# Patient Record
Sex: Male | Born: 2000 | Race: Black or African American | Hispanic: No | Marital: Single | Smoking: Current some day smoker
Health system: Southern US, Community
[De-identification: ages and names within clinical notes are randomized; demographics above are authoritative.]

---
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
## 2021-07-14 ENCOUNTER — Other Ambulatory Visit: Payer: Self-pay

## 2021-07-14 ENCOUNTER — Emergency Department (HOSPITAL_COMMUNITY)
Admission: EM | Admit: 2021-07-14 | Discharge: 2021-07-14 | Disposition: A | Discharge status: Home / Self Care | Payer: Medicaid Other | Attending: Emergency Medicine | Admitting: Emergency Medicine

## 2021-07-14 ENCOUNTER — Encounter (HOSPITAL_COMMUNITY): Payer: Self-pay | Admitting: *Deleted

## 2021-07-14 DIAGNOSIS — R112 Nausea with vomiting, unspecified: Secondary | ICD-10-CM | POA: Insufficient documentation

## 2021-07-14 DIAGNOSIS — Z1152 Encounter for screening for COVID-19: Secondary | ICD-10-CM

## 2021-07-14 DIAGNOSIS — Z20822 Contact with and (suspected) exposure to covid-19: Secondary | ICD-10-CM | POA: Insufficient documentation

## 2021-07-14 DIAGNOSIS — F17210 Nicotine dependence, cigarettes, uncomplicated: Secondary | ICD-10-CM | POA: Insufficient documentation

## 2021-07-14 NOTE — Discharge Instructions (Addendum)
Your exam today is reassuring and since your symptoms have been completely resolved for the past 8 hours, there is no need for further testing here.  You have been screened for COVID-19 per your employer's request.  It will be safe for you to return to work once this COVID test is resulted and is negative.  If it is positive, you will need to stay out of work for 5 days from today's date, returning on September 19, assuming you are symptom-free at that time.  Return here for any return or worsening of your symptoms.

## 2021-07-14 NOTE — ED Provider Notes (Signed)
[facility] EMERGENCY DEPARTMENT Provider Note   CSN: [id number] Arrival date & time: 07/14/21  1731     History Chief Complaint  Patient presents with   Covid Exposure    Daniel Bowers is a 20 y.o. male with no significant past medical history but he did have a significant surgical history including a partial colectomy secondary to perforated bowel in 2021 presenting with complaint of nausea and vomiting.  He was at work early this morning when he had back-to-back episodes of vomiting approximately 7 episodes of nonbloody emesis.  This was not accompanied by abdominal pain but he did have nausea.  He he was sent home from work and he slept until he woke around noon.  He has been able to eat and drink since waking and he denies any further symptoms specifically no nausea or vomiting, no abdominal pain or distention.  He also denies fevers or chills or any other complaints at this time.  He states he is essentially here at his employer's request to be screened for COVID-19 so that he can return to work.  The history is provided by the patient.      History reviewed. No pertinent past medical history.  Patient Active Problem List   Diagnosis Date Noted   Perforated bowel ([facility]) 11/12/2019    Past Surgical History:  Procedure Laterality Date   LAPAROTOMY N/A 11/12/2019   Procedure: EXPLORATORY LAPAROTOMY;  Surgeon: Leafy Ro, MD;  Location: [facility] ORS;  Service: General;  Laterality: N/A;   PARTIAL COLECTOMY  11/12/2019   Procedure: PARTIAL COLECTOMY;  Surgeon: Leafy Ro, MD;  Location: [facility] ORS;  Service: General;;   UMBILICAL HERNIA REPAIR  11/12/2019   Procedure: HERNIA REPAIR UMBILICAL ADULT;  Surgeon: Leafy Ro, MD;  Location: [facility] ORS;  Service: General;;       No family history on file.  Social History   Tobacco Use   Smoking status: Some Days    Types: Cigarettes   Smokeless tobacco: Never  Vaping Use   Vaping Use: Never used  Substance Use Topics    Alcohol use: No   Drug use: Never    Home Medications Prior to Admission medications   Medication Sig Start Date End Date Taking? Authorizing Provider  ibuprofen (ADVIL) 800 MG tablet Take 1 tablet (800 mg total) by mouth every 8 (eight) hours as needed. 11/15/19   Donovan Kail, PA-C    Allergies    Patient has no known allergies.  Review of Systems   Review of Systems  Constitutional:  Negative for chills and fever.  HENT:  Negative for congestion and sore throat.   Eyes: Negative.   Respiratory:  Negative for chest tightness and shortness of breath.   Cardiovascular:  Negative for chest pain.  Gastrointestinal:  Positive for nausea and vomiting. Negative for abdominal distention, abdominal pain, constipation and diarrhea.  Genitourinary: Negative.   Musculoskeletal:  Negative for arthralgias, joint swelling and neck pain.  Skin: Negative.  Negative for rash and wound.  Neurological:  Negative for dizziness, weakness, light-headedness, numbness and headaches.  Psychiatric/Behavioral: Negative.    All other systems reviewed and are negative.  Physical Exam Updated Vital Signs BP 128/80 (BP Location: Right Arm)   Pulse 66   Temp 98.3 F (36.8 C) (Oral)   Resp 14   Ht 5\' 11"  (1.803 m)   Wt 62.1 kg   SpO2 100%   BMI 19.09 kg/m   Physical Exam Vitals and nursing note reviewed.  Constitutional:      Appearance: He is well-developed.  HENT:     Head: Normocephalic and atraumatic.  Cardiovascular:     Rate and Rhythm: Normal rate and regular rhythm.     Heart sounds: Normal heart sounds.  Pulmonary:     Effort: Pulmonary effort is normal.     Breath sounds: Normal breath sounds. No wheezing.  Abdominal:     General: Bowel sounds are normal. There is no distension.     Palpations: Abdomen is soft.     Tenderness: There is no abdominal tenderness. There is no guarding or rebound.  Musculoskeletal:        General: Normal range of motion.  Skin:    General: Skin  is warm and dry.  Neurological:     Mental Status: He is alert.    ED Results / Procedures / Treatments   Labs (all labs ordered are listed, but only abnormal results are displayed) Labs Reviewed  SARS CORONAVIRUS 2 (TAT 6-24 HRS)    EKG None  Radiology No results found.  Procedures Procedures   Medications Ordered in ED Medications - No data to display  ED Course  I have reviewed the triage vital signs and the nursing notes.  Pertinent labs & imaging results that were available during my care of the patient were reviewed by me and considered in my medical decision making (see chart for details).    MDM Rules/Calculators/A&P                           Patient with episode of nausea and vomiting while at work this morning who is now completely asymptomatic.  He has a normal exam, normal vital signs and has no complaints of symptoms at this time.  No indication for labs or imaging.  He was provided a COVID-19 screening test, also a work note for him to be able to return as soon as his COVID test is negative.  Of course if positive he will need to be out for 5 days and this was discussed with him.  As needed follow-up anticipated.  Daniel Bowers was evaluated in Emergency Department on 07/14/2021 for the symptoms described in the history of present illness. He was evaluated in the context of the global COVID-19 pandemic, which necessitated consideration that the patient might be at risk for infection with the SARS-CoV-2 virus that causes COVID-19. Institutional protocols and algorithms that pertain to the evaluation of patients at risk for COVID-19 are in a state of rapid change based on information released by regulatory bodies including the CDC and federal and state organizations. These policies and algorithms were followed during the patient's care in the ED.  Final Clinical Impression(s) / ED Diagnoses Final diagnoses:  Non-intractable vomiting with nausea, unspecified  vomiting type  Encounter for screening for COVID-19    Rx / DC Orders ED Discharge Orders     None        Victoriano Lain 07/14/21 2138    Mancel Bale, MD 07/15/21 [phone]

## 2021-07-14 NOTE — ED Triage Notes (Signed)
Requesting covid test and note for work

## 2021-07-15 LAB — SARS CORONAVIRUS 2 (TAT 6-24 HRS): SARS Coronavirus 2: NEGATIVE

## 2022-10-06 ENCOUNTER — Encounter (HOSPITAL_COMMUNITY): Payer: Self-pay

## 2022-10-06 ENCOUNTER — Other Ambulatory Visit: Payer: Self-pay

## 2022-10-06 ENCOUNTER — Emergency Department (HOSPITAL_COMMUNITY)
Admission: EM | Admit: 2022-10-06 | Discharge: 2022-10-06 | Disposition: A | Discharge status: Home / Self Care | Payer: Self-pay | Attending: Emergency Medicine | Admitting: Emergency Medicine

## 2022-10-06 DIAGNOSIS — U071 COVID-19: Secondary | ICD-10-CM | POA: Insufficient documentation

## 2022-10-06 LAB — RESP PANEL BY RT-PCR (FLU A&B, COVID) ARPGX2
Influenza A by PCR: NEGATIVE
Influenza B by PCR: NEGATIVE
SARS Coronavirus 2 by RT PCR: POSITIVE — AB

## 2022-10-06 NOTE — ED Provider Notes (Signed)
[facility] EMERGENCY DEPARTMENT Provider Note   CSN: [id number] Arrival date & time: 10/06/22  1115     History  Chief Complaint  Patient presents with   Generalized Body Aches    Daniel Bowers is a 21 y.o. male with no chronic medical problems who presents to the ED complaining of a productive cough producing clear sputum, generalized body aches, sore throat, and left ear pain that started last night.  He denies fever, chills, chest pain, shortness of breath, abdominal pain, nausea, vomiting, diarrhea, or other concerns at this time.  He was COVID vaccinated and received 2 vaccines with the last approximately 6 months ago.  He has been using Robitussin-DM at home to manage his cough.  He has not been around any known sick contacts but does work around [employer].  He is a 1 pack/day smoker.  He lives with his nephew.  He denies any history of asthma, COPD, heart disease, or other heart/lung pathologies or chronic disease.       Home Medications Prior to Admission medications   Medication Sig Start Date End Date Taking? Authorizing Provider  ibuprofen (ADVIL) 800 MG tablet Take 1 tablet (800 mg total) by mouth every 8 (eight) hours as needed. 11/15/19   Donovan Kail, PA-C      Allergies    Patient has no known allergies.    Review of Systems   Review of Systems  Constitutional:  Negative for activity change, appetite change, chills and fever.  HENT:  Positive for congestion, ear pain, rhinorrhea and sore throat. Negative for postnasal drip, sinus pain, trouble swallowing and voice change.   Eyes:  Negative for pain and visual disturbance.  Respiratory:  Positive for cough. Negative for chest tightness, shortness of breath and wheezing.   Cardiovascular:  Negative for chest pain and palpitations.  Gastrointestinal:  Negative for abdominal pain, constipation, diarrhea, nausea and vomiting.  Genitourinary:  Negative for dysuria and hematuria.  Musculoskeletal:  Positive  for myalgias.  Skin:  Negative for color change and rash.  Neurological:  Negative for dizziness, seizures, syncope, weakness, light-headedness and headaches.  All other systems reviewed and are negative.   Physical Exam Updated Vital Signs BP (!) 154/110   Pulse 78   Temp 98.7 F (37.1 C) (Oral)   Resp 20   Ht 5\' 11"  (1.803 m)   Wt 68 kg   SpO2 95%   BMI 20.92 kg/m  Physical Exam Vitals and nursing note reviewed.  Constitutional:      General: He is not in acute distress.    Appearance: Normal appearance. He is not ill-appearing, toxic-appearing or diaphoretic.  HENT:     Head: Normocephalic and atraumatic.     Right Ear: Tympanic membrane, ear canal and external ear normal.     Left Ear: Tympanic membrane, ear canal and external ear normal.     Nose: Congestion (mild) present.     Mouth/Throat:     Mouth: Mucous membranes are moist.     Pharynx: Oropharynx is clear. Posterior oropharyngeal erythema (mild posterior pharynx) present. No oropharyngeal exudate.  Eyes:     General:        Right eye: No discharge.        Left eye: No discharge.     Conjunctiva/sclera: Conjunctivae normal.  Cardiovascular:     Rate and Rhythm: Normal rate and regular rhythm.     Heart sounds: No murmur heard. Pulmonary:     Effort: Pulmonary effort is normal.  No respiratory distress.     Breath sounds: Normal breath sounds. No wheezing, rhonchi or rales.  Abdominal:     General: Abdomen is flat.     Palpations: Abdomen is soft.     Tenderness: There is no abdominal tenderness. There is no guarding or rebound.  Musculoskeletal:        General: Normal range of motion.     Cervical back: Neck supple.     Right lower leg: No edema.     Left lower leg: No edema.  Skin:    General: Skin is warm and dry.     Capillary Refill: Capillary refill takes less than 2 seconds.  Neurological:     Mental Status: He is alert. Mental status is at baseline.  Psychiatric:        Behavior: Behavior  normal.        Thought Content: Thought content normal.     ED Results / Procedures / Treatments   Labs (all labs ordered are listed, but only abnormal results are displayed) Labs Reviewed  RESP PANEL BY RT-PCR (FLU A&B, COVID) ARPGX2 - Abnormal; Notable for the following components:      Result Value   SARS Coronavirus 2 by RT PCR POSITIVE (*)    All other components within normal limits    EKG None  Radiology No results found.  Procedures None  Medications Ordered in ED Medications - No data to display  ED Course/ Medical Decision Making/ A&P                           Medical Decision Making  This is a healthy 21 year old male who presents with viral symptoms that started last night.  His main complaint is generalized body aches that he has also had a productive cough, sore throat, and left ear pain.  He has had no sick contacts but does work for a [employer].  He has been managing symptoms with over-the-counter Robitussin and has been afebrile both at home and in the ED.  He is not tachycardic and is breathing with ease without any abnormal lung sounds on exam.  He has been COVID vaccinated.  Viral panel returns positive for COVID-19 and negative for influenza.  With patient's reassuring exam and no history of lung or heart disease, patient is a good candidate for outpatient management.   He is also given primary care follow-up for if his symptoms continue past the expected timeframe of 5 to 7 days.  Aware of the need to quarantine for the next 5 days and given a work excuse until next Monday 12/11.  Aware that if he develops a fever or worsening symptoms this quarantine may need to be extended.  Do not suspect a pneumonia as lungs are clear.  Throat examination did show some mild posterior pharynx erythema but no exudates in the patent airway and with patient's cough believe erythema can be explained by this and do not suspect strep throat. He was given strict return  precautions for shortness of breath, chest pain, confusion, intractable vomiting, or other concerning symptoms and aware he should avoid unnecessary contact with others and being around those with underlying chronic medical conditions such as asthma, COPD, CAD, etc. Tobacco cessation counseling provided. Not a candidate for paxlovid at this time and instructed to manage fever, cough, congestion, and body aches with over the counter medications such as Tylenol, ibuprofen, and decongestants. Case discussed with supervising MD who  agreed with plan.          Final Clinical Impression(s) / ED Diagnoses Final diagnoses:  COVID-19    Rx / DC Orders ED Discharge Orders     None         Tonette Lederer, PA-C 10/06/22 1546    Eber Hong, MD 10/07/22 1527

## 2022-10-06 NOTE — Discharge Instructions (Signed)
Thank you for letting us take care of you today.  As discussed, your labs returned positive for COVID-19. Your exam was reassuring so it is safe to discharge you home at this time, but if you develop shortness of breath, chest pain, fever not controlled with Tylenol or ibuprofen, confusion, inability to keep food or liquids down, you should return to ED for re-evaluation.  I have provided you the names of 2 primary care clinics to follow-up with if you are not better within the next week. You may also go to a clinic of your own choosing.  It is important you quarantine for 5 days from when your symptoms began. I have provided you a work excuse for this. Try to avoid being around anyone who is immunocompromised or has chronic lung issues such as asthma or COPD.

## 2022-10-06 NOTE — ED Triage Notes (Signed)
Pt presents to ED with complaints of generalized body aches, headache, cough, sore throat started last night, unsure of fever
